# Patient Record
Sex: Male | Born: 2000 | Race: White | Hispanic: No | Marital: Married | Smoking: Never smoker
Health system: Southern US, Community
[De-identification: ages and names within clinical notes are randomized; demographics above are authoritative.]

## PROBLEM LIST (undated history)

## (undated) ENCOUNTER — Emergency Department (HOSPITAL_COMMUNITY): Payer: BC Managed Care – PPO | Source: Home / Self Care

---
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
## 2018-06-16 IMAGING — NM NM HEPATO W/GB/PHARM/[PERSON_NAME]
2 series · 12 of 12 positions shown · non-contrast
Comparison: Abdominal ultrasound 01/05/2017

CLINICAL DATA: Abdominal pain, cramping, diarrhea, and nausea for
the past year.

EXAM:
NUCLEAR MEDICINE HEPATOBILIARY IMAGING WITH GALLBLADDER EF
TECHNIQUE: Sequential images of the abdomen were obtained [DATE] minutes
following intravenous administration of radiopharmaceutical. After
oral ingestion of Ensure, gallbladder ejection fraction was
determined. At 60 min, normal ejection fraction is greater than 33%.
RADIOPHARMACEUTICALS:  5.2 mCi Xc-OOm  Choletec IV

[Series 1000: gallbladder ef · 4.80mm/px · 6 of 120 frames shown]
[frame 11/120]
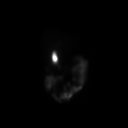
[frame 31/120]
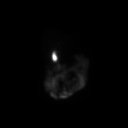
[frame 51/120]
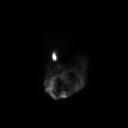
[frame 71/120]
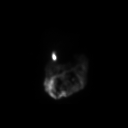
[frame 91/120]
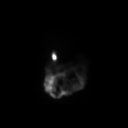
[frame 111/120]
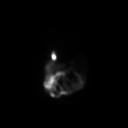

[Series 1000: hepatobiliary scan · 9.59mm/px · 6 of 60 frames shown]
[frame 6/60]
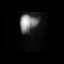
[frame 16/60]
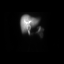
[frame 26/60]
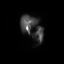
[frame 36/60]
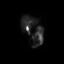
[frame 46/60]
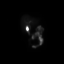
[frame 56/60]
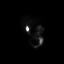

[12 of 12 positions shown; findings below may reference images not displayed]

FINDINGS: Satisfactory uptake of radiopharmaceutical from the blood pool.
Biliary activity at 7 minutes. Bowel activity at 11 minutes.
Gallbladder activity at 18 minutes.

No symptoms after drinking the Ensure meal.

Calculated gallbladder ejection fraction is 54%. (Normal gallbladder
ejection fraction with Ensure is greater than 33%.)
IMPRESSION: Normal hepatobiliary scan, gallbladder ejection fraction of 54%.

## 2019-06-23 ENCOUNTER — Other Ambulatory Visit: Payer: Self-pay | Admitting: Physician Assistant

## 2019-06-24 LAB — SURGICAL PATHOLOGY

## 2021-09-12 ENCOUNTER — Ambulatory Visit
Admission: EM | Admit: 2021-09-12 | Discharge: 2021-09-12 | Disposition: A | Discharge status: Home / Self Care | Payer: BC Managed Care – PPO | Attending: Emergency Medicine | Admitting: Emergency Medicine

## 2021-09-12 DIAGNOSIS — R519 Headache, unspecified: Secondary | ICD-10-CM | POA: Insufficient documentation

## 2021-09-12 DIAGNOSIS — H543 Unqualified visual loss, both eyes: Secondary | ICD-10-CM | POA: Diagnosis present

## 2021-09-12 DIAGNOSIS — R112 Nausea with vomiting, unspecified: Secondary | ICD-10-CM | POA: Insufficient documentation

## 2021-09-12 DIAGNOSIS — R42 Dizziness and giddiness: Secondary | ICD-10-CM | POA: Diagnosis present

## 2021-09-12 LAB — CBC WITH DIFFERENTIAL/PLATELET
Abs Immature Granulocytes: 0.05 10*3/uL (ref 0.00–0.07)
Basophils Absolute: 0.1 10*3/uL (ref 0.0–0.1)
Basophils Relative: 1 %
Eosinophils Absolute: 0.2 10*3/uL (ref 0.0–0.5)
Eosinophils Relative: 2 %
HCT: 46.6 % (ref 39.0–52.0)
Hemoglobin: 16.4 g/dL (ref 13.0–17.0)
Immature Granulocytes: 1 %
Lymphocytes Relative: 35 %
Lymphs Abs: 3.3 10*3/uL (ref 0.7–4.0)
MCH: 30.1 pg (ref 26.0–34.0)
MCHC: 35.2 g/dL (ref 30.0–36.0)
MCV: 85.7 fL (ref 80.0–100.0)
Monocytes Absolute: 1 10*3/uL (ref 0.1–1.0)
Monocytes Relative: 10 %
Neutro Abs: 4.9 10*3/uL (ref 1.7–7.7)
Neutrophils Relative %: 51 %
Platelets: 304 10*3/uL (ref 150–400)
RBC: 5.44 MIL/uL (ref 4.22–5.81)
RDW: 12.5 % (ref 11.5–15.5)
WBC: 9.5 10*3/uL (ref 4.0–10.5)
nRBC: 0 % (ref 0.0–0.2)

## 2021-09-12 LAB — COMPREHENSIVE METABOLIC PANEL
ALT: 19 U/L (ref 0–44)
AST: 20 U/L (ref 15–41)
Albumin: 4.8 g/dL (ref 3.5–5.0)
Alkaline Phosphatase: 52 U/L (ref 38–126)
Anion gap: 6 (ref 5–15)
BUN: 14 mg/dL (ref 6–20)
CO2: 28 mmol/L (ref 22–32)
Calcium: 9.7 mg/dL (ref 8.9–10.3)
Chloride: 103 mmol/L (ref 98–111)
Creatinine, Ser: 0.98 mg/dL (ref 0.61–1.24)
GFR, Estimated: >60 mL/min (ref >60)
Glucose, Bld: 93 mg/dL (ref 70–99)
Potassium: 3.3 mmol/L — ABNORMAL LOW (ref 3.5–5.1)
Sodium: 137 mmol/L (ref 135–145)
Total Bilirubin: 0.6 mg/dL (ref 0.3–1.2)
Total Protein: 8 g/dL (ref 6.5–8.1)

## 2021-09-12 LAB — VITAMIN B12: Vitamin B-12: 643 pg/mL (ref 180–914)

## 2021-09-12 LAB — TSH: TSH: 1.687 u[IU]/mL (ref 0.350–4.500)

## 2021-09-12 MED ORDER — MECLIZINE HCL 12.5 MG PO TABS: 12.5000 mg | ORAL_TABLET | Freq: Three times a day (TID) | ORAL | 0 refills | Status: DC | PRN

## 2021-09-12 MED ORDER — IBUPROFEN 800 MG PO TABS: 800.0000 mg | ORAL_TABLET | Freq: Three times a day (TID) | ORAL | 0 refills | Status: DC

## 2021-09-12 MED ORDER — ONDANSETRON HCL 4 MG PO TABS: 4.0000 mg | ORAL_TABLET | Freq: Four times a day (QID) | ORAL | 0 refills | Status: AC

## 2021-09-12 NOTE — ED Triage Notes (Addendum)
Pt c/o lightheadedness, dizziness, issues with vision x6months. Pt states that at 5pm his eyes and vision will get blurry and he will be unable to focus. Pt has "bumps" of dizziness and lightheadedness that lasts for 10 min.  ? ?Pt states that these symptoms have gotten worse and it now happens throughout the day and it causes nausea and vomiting.  ? ?Pt denies any dizziness when standing or sitting up. ? ?Pt denies any exertion, falls, or injuries. Pt denies any changes of medication or diet. ? ?Pt has been taking his BP recently and the readings are :123/78, 116/78, 125/81, 114/72 ? ?On 09/09/21 pt had a BP reading of 190/110 ? ?Pt states that he has to focus heavily when his vision is blurry and he "gets tunnel vision" when focusing.  ? ?Pt is B12 deficient and has been taking that in the past week.  ?

## 2021-09-12 NOTE — ED Provider Notes (Signed)
?[facility] ? ? ? ?CSN: [id number] ?Arrival date & time: 09/12/21  1729 ? ? ?  ? ?History   ?Chief Complaint ?Chief Complaint  ?Patient presents with  ? Dizziness  ? Blurred Vision  ? ? ?HPI ?Shawn Ferguson is a 21 y.o. male.  ? ?Patient presents with blurred vision, dizziness, lightheadedness, headaches and a sensation of fogginess for 2 months.  Had an episode of vomiting occurring twice over the last 2 weeks.  Initial symptom was blurred vision which is described as an increased need to focus to be able to see, near and far vision is affected, endorses black floaters as well.  Symptoms have become more frequent occurring at least daily for the last 2 weeks.  Endorses intermittent dizziness described as the room spinning, independent of movement, occurring at least weekly.  Endorses a headache fluctuating in location, described as pressure occurring weekly.  Symptoms are lasting for 30 minutes to hours resolving spontaneously.  Endorses an associated sensation of fogginess.  Has not attempted treatment of symptoms.  Denies syncope, memory or speech changes, weakness morning, numbness or tingling,, chest pain or shortness of breath.  Denies injury or trauma prior to symptoms beginning.  History of IBS and vitamin B12 deficiency.  Has upcoming PCP appointment this Thursday.  Began taking a vitamin B12 tablet 3 days ago.  ? ?History reviewed. No pertinent past medical history. ? ?There are no problems to display for this patient. ? ? ?History reviewed. No pertinent surgical history. ? ? ? ? ?Home Medications   ? ?Prior to Admission medications   ?Medication Sig Start Date End Date Taking? Authorizing Provider  ?ondansetron (ZOFRAN ODT) 8 MG disintegrating tablet Take 1 tablet (8 mg total) by mouth 2 (two) times daily. 07/21/15   Payton Mccallum, MD  ?oseltamivir (TAMIFLU) 75 MG capsule Take 1 capsule (75 mg total) by mouth 2 (two) times daily. 07/21/15   Payton Mccallum, MD  ? ? ?Family History ?History  reviewed. No pertinent family history. ? ?Social History ?Social History  ? ?Tobacco Use  ? Smoking status: Never  ? Smokeless tobacco: Never  ?Vaping Use  ? Vaping Use: Never used  ?Substance Use Topics  ? Alcohol use: No  ? Drug use: Never  ? ? ? ?Allergies   ?Patient has no known allergies. ? ? ?Review of Systems ?Review of Systems ?Defer to HPI  ? ? ?Physical Exam ?Triage Vital Signs ?ED Triage Vitals  ?Enc Vitals Group  ?   BP 09/12/21 1747 (!) 149/90  ?   Pulse Rate 09/12/21 1747 90  ?   Resp 09/12/21 1747 18  ?   Temp 09/12/21 1747 98.4 ?F (36.9 ?C)  ?   Temp Source 09/12/21 1747 Oral  ?   SpO2 09/12/21 1747 100 %  ?   Weight 09/12/21 1746 170 lb (77.1 kg)  ?   Height 09/12/21 1746 5\' 9"  (1.753 m)  ?   Head Circumference --   ?   Peak Flow --   ?   Pain Score 09/12/21 1745 3  ?   Pain Loc --   ?   Pain Edu? --   ?   Excl. in GC? --   ? ?No data found. ? ?Updated Vital Signs ?BP (!) 149/90 (BP Location: Left Arm)   Pulse 90   Temp 98.4 ?F (36.9 ?C) (Oral)   Resp 18   Ht 5\' 9"  (1.753 m)   Wt 170 lb (77.1 kg)  SpO2 100%   BMI 25.10 kg/m?  ? ?Visual Acuity ?Right Eye Distance:   ?Left Eye Distance:   ?Bilateral Distance:   ? ?Right Eye Near:   ?Left Eye Near:    ?Bilateral Near:    ? ?Physical Exam ?Constitutional:   ?   Appearance: Normal appearance.  ?HENT:  ?   Head: Normocephalic.  ?Eyes:  ?   Extraocular Movements: Extraocular movements intact.  ?   Conjunctiva/sclera: Conjunctivae normal.  ?   Pupils: Pupils are equal, round, and reactive to light.  ?Cardiovascular:  ?   Rate and Rhythm: Normal rate and regular rhythm.  ?   Pulses: Normal pulses.  ?   Heart sounds: Normal heart sounds.  ?Pulmonary:  ?   Effort: Pulmonary effort is normal.  ?   Breath sounds: Normal breath sounds.  ?Neurological:  ?   General: No focal deficit present.  ?   Mental Status: He is alert and oriented to person, place, and time. Mental status is at baseline.  ?   Cranial Nerves: No cranial nerve deficit.  ?   Sensory: No  sensory deficit.  ?   Motor: No weakness.  ?   Coordination: Coordination normal.  ?   Gait: Gait normal.  ?Psychiatric:     ?   Mood and Affect: Mood normal.     ?   Behavior: Behavior normal.  ? ? ? ?UC Treatments / Results  ?Labs ?(all labs ordered are listed, but only abnormal results are displayed) ?Labs Reviewed - No data to display ? ?EKG ? ? ?Radiology ?No results found. ? ?Procedures ?Procedures (including critical care time) ? ?Medications Ordered in UC ?Medications - No data to display ? ?Initial Impression / Assessment and Plan / UC Course  ?I have reviewed the triage vital signs and the nursing notes. ? ?Pertinent labs & imaging results that were available during my care of the patient were reviewed by me and considered in my medical decision making (see chart for details). ? ?Dizziness ?Bad headache ?Impaired vision in both eyes ?Nausea and vomiting ? ?Etiology of symptoms is unknown, discussed with patient, vital signs are stable and patient is in no signs of distress, no symptoms present while in exam room, neurological exam without abnormality, EKG showing normal sinus rhythm,'s CMP, CBC, TSH and vitamin B12 pending, mom has upcoming appointment this Thursday with PCP, recommended keeping appointment for reevaluation and further work-up, given strict precautions that at any point any symptom worsens or new symptoms.  He is to go to the nearest emergency department for full work-up and evaluation, verbalized understanding ?Final Clinical Impressions(s) / UC Diagnoses  ? ?Final diagnoses:  ?None  ? ?Discharge Instructions   ?None ?  ? ?ED Prescriptions   ?None ?  ? ?PDMP not reviewed this encounter. ?  ?Valinda Hoar, NP ?09/13/21 1337 ? ?

## 2021-09-12 NOTE — Discharge Instructions (Addendum)
The cause of your symptoms is unknown at this time, no abnormalities noted on your neurological exam ? ?EKG showing TSH normal rhythm and pace ? ?We have completed blood work to check your liver, kidneys, electrolytes, thyroid, B12 level, you will be notified of any concerning values and told how to move forward ? ?Please keep your upcoming appointment with your PCP on Thursday for reevaluation and further management of symptoms ? ?You may use meclizine every 8 hours as needed for management of dizziness ? ?You may use ibuprofen every 8 hours for management of headache ? ?You may use Zofran every 6 hours for management of nausea and vomiting ? ?At any point if your symptoms worsen you will need to go to the nearest emergency department for further evaluation and imaging ?

## 2023-02-12 ENCOUNTER — Ambulatory Visit
Admission: EM | Admit: 2023-02-12 | Discharge: 2023-02-12 | Disposition: A | Discharge status: Home / Self Care | Payer: 59 | Attending: Physician Assistant | Admitting: Physician Assistant

## 2023-02-12 DIAGNOSIS — Z1152 Encounter for screening for COVID-19: Secondary | ICD-10-CM | POA: Insufficient documentation

## 2023-02-12 DIAGNOSIS — R051 Acute cough: Secondary | ICD-10-CM | POA: Insufficient documentation

## 2023-02-12 DIAGNOSIS — R1084 Generalized abdominal pain: Secondary | ICD-10-CM | POA: Diagnosis not present

## 2023-02-12 DIAGNOSIS — R112 Nausea with vomiting, unspecified: Secondary | ICD-10-CM | POA: Insufficient documentation

## 2023-02-12 LAB — RESP PANEL BY RT-PCR (RSV, FLU A&B, COVID)  RVPGX2
Influenza A by PCR: NEGATIVE
Influenza B by PCR: NEGATIVE
Resp Syncytial Virus by PCR: NEGATIVE
SARS Coronavirus 2 by RT PCR: NEGATIVE

## 2023-02-12 MED ORDER — ONDANSETRON 4 MG PO TBDP: 4.0000 mg | ORAL_TABLET | Freq: Three times a day (TID) | ORAL | 0 refills | Status: AC | PRN

## 2023-02-12 NOTE — ED Provider Notes (Signed)
[facility]    CSN: [id number] Arrival date & time: 02/12/23  1727      History   Chief Complaint Chief Complaint  Patient presents with   Emesis   Nausea   Generalized Body Aches   Headache    HPI Shawn Ferguson is a 22 y.o. male presenting for generalized abdominal cramping, nausea with 1 episode of vomiting, headaches and cough that began today.  Patient says he also felt feverish.  Took some Tylenol and checked his temperature which was 99 degrees after taking Tylenol.  Has all of taken Pepto-Bismol.  Denies sore throat, congestion, wheezing or shortness of breath, diarrhea.  No report of any sick contacts.  States he has an 75-month-old baby and wants to get tested for everything that could be contagious to the baby.  HPI  History reviewed. No pertinent past medical history.  There are no problems to display for this patient.   History reviewed. No pertinent surgical history.     Home Medications    Prior to Admission medications   Medication Sig Start Date End Date Taking? Authorizing Provider  ondansetron (ZOFRAN-ODT) 4 MG disintegrating tablet Take 1 tablet (4 mg total) by mouth every 8 (eight) hours as needed. 02/12/23  Yes Eusebio Friendly B, PA-C  ondansetron (ZOFRAN) 4 MG tablet Take 1 tablet (4 mg total) by mouth every 6 (six) hours. 09/12/21   Valinda Hoar, NP    Family History History reviewed. No pertinent family history.  Social History Social History   Tobacco Use   Smoking status: Never   Smokeless tobacco: Never  Vaping Use   Vaping status: Never Used  Substance Use Topics   Alcohol use: No   Drug use: Never     Allergies   Patient has no known allergies.   Review of Systems Review of Systems  Constitutional:  Positive for fatigue. Negative for fever.  HENT:  Negative for congestion, rhinorrhea, sinus pressure, sinus pain and sore throat.   Respiratory:  Positive for cough. Negative for shortness of breath.    Cardiovascular:  Negative for chest pain.  Gastrointestinal:  Positive for abdominal pain, nausea and vomiting. Negative for constipation and diarrhea.  Musculoskeletal:  Positive for back pain and myalgias.  Neurological:  Positive for headaches. Negative for weakness and light-headedness.  Hematological:  Negative for adenopathy.     Physical Exam Triage Vital Signs ED Triage Vitals  Encounter Vitals Group     BP 02/12/23 1736 123/86     Systolic BP Percentile --      Diastolic BP Percentile --      Pulse Rate 02/12/23 1736 98     Resp 02/12/23 1736 19     Temp 02/12/23 1736 98.5 F (36.9 C)     Temp Source 02/12/23 1736 Oral     SpO2 02/12/23 1736 95 %     Weight --      Height --      Head Circumference --      Peak Flow --      Pain Score 02/12/23 1735 0     Pain Loc --      Pain Education --      Exclude from Growth Chart --    No data found.  Updated Vital Signs BP 123/86 (BP Location: Right Arm)   Pulse 98   Temp 98.5 F (36.9 C) (Oral)   Resp 19   SpO2 95%      Physical Exam Vitals  and nursing note reviewed.  Constitutional:      General: He is not in acute distress.    Appearance: Normal appearance. He is well-developed. He is not ill-appearing.  HENT:     Head: Normocephalic and atraumatic.     Nose: Nose normal.     Mouth/Throat:     Mouth: Mucous membranes are moist.     Pharynx: Oropharynx is clear.  Eyes:     General: No scleral icterus.    Conjunctiva/sclera: Conjunctivae normal.  Cardiovascular:     Rate and Rhythm: Normal rate and regular rhythm.     Heart sounds: Normal heart sounds.  Pulmonary:     Effort: Pulmonary effort is normal. No respiratory distress.     Breath sounds: Normal breath sounds.  Abdominal:     Palpations: Abdomen is soft.     Tenderness: There is abdominal tenderness (generalized). There is no guarding or rebound.  Musculoskeletal:     Cervical back: Neck supple.  Skin:    General: Skin is warm and dry.      Capillary Refill: Capillary refill takes less than 2 seconds.  Neurological:     General: No focal deficit present.     Mental Status: He is alert. Mental status is at baseline.     Motor: No weakness.     Gait: Gait normal.  Psychiatric:        Mood and Affect: Mood normal.        Behavior: Behavior normal.      UC Treatments / Results  Labs (all labs ordered are listed, but only abnormal results are displayed) Labs Reviewed  RESP PANEL BY RT-PCR (RSV, FLU A&B, COVID)  [id number]    EKG   Radiology No results found.  Procedures Procedures (including critical care time)  Medications Ordered in UC Medications - No data to display  Initial Impression / Assessment and Plan / UC Course  I have reviewed the triage vital signs and the nursing notes.  Pertinent labs & imaging results that were available during my care of the patient were reviewed by me and considered in my medical decision making (see chart for details).   22 year old male presents for subjective fever, fatigue, body aches, headaches, cough, nausea with an episode of vomiting and generalized abdominal pain that began today.  Vitals are normal and stable.  He is overall well-appearing.  No acute distress.  Abdomen soft with generalized tenderness to palpation.  Normal HEENT exam.  Chest clear.  Respiratory panel obtained. All negative.   Negative respiratory panel.  Result communicated to patient.  Viral illness most likely.  Patient given Zofran.  Encouraged increasing rest and fluids, antipyretic use.  We reviewed if he continues to have your has persistent, localized worsening abdominal discomfort or is not improving in the next 24 to 48 hours he should be seen again and reevaluated.  For any acute worsening of symptoms he is to go to the ER.   Final Clinical Impressions(s) / UC Diagnoses   Final diagnoses:  Generalized abdominal pain  Nausea and vomiting, unspecified vomiting type  Acute cough      Discharge Instructions      -Checking for COVID, flu and RSV.  I will call you once I get these results. - If you have any of these conditions need to isolate until even fever free for 24 hours any symptoms are improving. - Care is supportive.  I sent nausea medicine to the pharmacy.  Increase your fluids and  rest. - If the respiratory panel is negative you may just have a stomach virus which would be contagious to your baby but we do not test for those viruses in the urgent care.  Those symptoms usually get better within a couple of days.  Sanitize well.  Increase rest and fluids.  Tylenol Motrin as needed for fever control. - If you have persistent fever or localized abdominal pain in your testing is negative you should consider going to the ER for further evaluation to check for things like appendicitis.     ED Prescriptions     Medication Sig Dispense Auth. Provider   ondansetron (ZOFRAN-ODT) 4 MG disintegrating tablet Take 1 tablet (4 mg total) by mouth every 8 (eight) hours as needed. 15 tablet Gareth Morgan      PDMP not reviewed this encounter.   Shirlee Latch, PA-C 02/12/23 1904

## 2023-02-12 NOTE — Discharge Instructions (Signed)
-  Checking for COVID, flu and RSV.  I will call you once I get these results. - If you have any of these conditions need to isolate until even fever free for 24 hours any symptoms are improving. - Care is supportive.  I sent nausea medicine to the pharmacy.  Increase your fluids and rest. - If the respiratory panel is negative you may just have a stomach virus which would be contagious to your baby but we do not test for those viruses in the urgent care.  Those symptoms usually get better within a couple of days.  Sanitize well.  Increase rest and fluids.  Tylenol Motrin as needed for fever control. - If you have persistent fever or localized abdominal pain in your testing is negative you should consider going to the ER for further evaluation to check for things like appendicitis.

## 2023-02-12 NOTE — ED Triage Notes (Signed)
Patient states that sx started today. Headache-emesis-nausea-abdominal pain. Took Tylenol today. Unsure of time.
